# Patient Record
Sex: Female | Born: 1995 | Race: Black or African American | Hispanic: No | Marital: Single | Smoking: Never smoker
Health system: Southern US, Community
[De-identification: ages and names within clinical notes are randomized; demographics above are authoritative.]

## PROBLEM LIST (undated history)

## (undated) DIAGNOSIS — E282 Polycystic ovarian syndrome: Secondary | ICD-10-CM

## (undated) HISTORY — DX: Polycystic ovarian syndrome: E28.2

---
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
## 2013-01-16 ENCOUNTER — Encounter: Payer: Self-pay | Admitting: Internal Medicine

## 2013-01-30 ENCOUNTER — Telehealth: Payer: Self-pay | Admitting: Internal Medicine

## 2013-01-30 NOTE — Telephone Encounter (Signed)
Called patient advised patient to go to ER. Due to nausea vomiting and upper abdominal pain.

## 2013-01-30 NOTE — Telephone Encounter (Signed)
Patient Information:  Caller Name: Albin Felling  Phone: [phone]  Patient: Megan Buckley, Megan Buckley  Gender: Female  DOB: 16-Jun-1996  Age: 17 Years  PCP: Ronna Polio (Adults only)  Pregnant: No  Office Follow Up:  Does the office need to follow up with this patient?: Yes  Instructions For The Office: Please return call to Mom at [phone] regarding possible work in appt. for 01/30/13.  RN Note:  Mom states patient developed vomiting, onset 01/25/13. States emesis X 1 01/29/13. States no vomiting 01/30/13. No diarrhea. Afebrile. Mom states child tolerated clear fluids 01/30/13. Urinating normally for patient. Mucous membranes moist.  Mom states patient developed epigastric pain, onset 01/25/13 after vomiting. States patient continues to complain of upper abdominal discomfort. States patient is unable to ambulate in an upright position, at intervals, due to pain. States area is tender to touch. Care advice and diet advice given per guidelines. Call back parameters reviewed. Mom verbalizes understanding. Please return call to Mom at [phone] regarding possible work in appt. for 01/30/13.  Symptoms  Reason For Call & Symptoms: Vomiting  Reviewed Health History In EMR: Yes  Reviewed Medications In EMR: Yes  Reviewed Allergies In EMR: Yes  Reviewed Surgeries / Procedures: Yes  Date of Onset of Symptoms: 01/26/2013  Treatments Tried: Clear liquids  Treatments Tried Worked: No OB / GYN:  LMP: 01/18/2013  Guideline(s) Used:  Vomiting  Abdominal Pain - Female  Abdominal Pain - Upper  Disposition Per Guideline:   See Today in Office  Reason For Disposition Reached:   Patient wants to be seen  Advice Given:  Clear Liquids:  Sip water or a rehydration drink (e.g., Gatorade or Powerade).  Clear Liquids:  Sip water or a rehydration drink (e.g., Gatorade or Powerade).  Other options: 1/2 strength flat lemon-lime soda or ginger ale.  After 4 hours without vomiting, increase the  amount.  Expected Course:  Vomiting from viral gastritis usually stops in 12 to 48 hours.  Call Back If:  Signs of dehydration occur  You become worse.  Call Back If:  Signs of dehydration occur  You become worse.  Fluids:   Sip clear fluids only (e.g., water, flat soft drinks, or half-strength fruit juice) until the pain is gone for 2 hours. Then slowly return to a regular diet.  Call Back If:  Abdominal pain is constant and present for more than 2 hours.  You become worse.  Patient Will Follow Care Advice:  YES

## 2015-01-23 ENCOUNTER — Encounter: Payer: Self-pay | Admitting: Obstetrics & Gynecology

## 2015-02-12 ENCOUNTER — Telehealth: Payer: Self-pay | Admitting: *Deleted

## 2015-02-12 NOTE — Telephone Encounter (Signed)
Pt's mother contacted the clinic to request to speak with a nurse.  Contacted patient's mother, she states daughter is having pain and she thinks her daughter has fibroids.  Pt is in the bed unable to speak to nurse.  Informed mother that if pain is severe to go to [facility].  She verbalizes understanding.

## 2015-02-15 ENCOUNTER — Encounter: Payer: Self-pay | Admitting: Obstetrics & Gynecology

## 2015-02-22 ENCOUNTER — Ambulatory Visit (INDEPENDENT_AMBULATORY_CARE_PROVIDER_SITE_OTHER): Payer: 59 | Admitting: Obstetrics & Gynecology

## 2015-02-22 ENCOUNTER — Encounter: Payer: Self-pay | Admitting: Obstetrics & Gynecology

## 2015-02-22 VITALS — BP 142/80 | HR 84 | Ht 65.0 in | Wt 344.5 lb

## 2015-02-22 DIAGNOSIS — N939 Abnormal uterine and vaginal bleeding, unspecified: Secondary | ICD-10-CM | POA: Diagnosis not present

## 2015-02-22 MED ORDER — MISOPROSTOL 200 MCG PO TABS: 400.0000 ug | ORAL_TABLET | Freq: Once | ORAL | Status: DC

## 2015-02-22 NOTE — Patient Instructions (Signed)
Levonorgestrel intrauterine device (IUD) What is this medicine? LEVONORGESTREL IUD (LEE voe nor jes trel) is a contraceptive (birth control) device. The device is placed inside the uterus by a healthcare professional. It is used to prevent pregnancy and can also be used to treat heavy bleeding that occurs during your period. Depending on the device, it can be used for 3 to 5 years. This medicine may be used for other purposes; ask your health care provider or pharmacist if you have questions. COMMON BRAND NAME(S): LILETTA, Mirena, Skyla What should I tell my health care provider before I take this medicine? They need to know if you have any of these conditions: -abnormal Pap smear -cancer of the breast, uterus, or cervix -diabetes -endometritis -genital or pelvic infection now or in the past -have more than one sexual partner or your partner has more than one partner -heart disease -history of an ectopic or tubal pregnancy -immune system problems -IUD in place -liver disease or tumor -problems with blood clots or take blood-thinners -use intravenous drugs -uterus of unusual shape -vaginal bleeding that has not been explained -an unusual or allergic reaction to levonorgestrel, other hormones, silicone, or polyethylene, medicines, foods, dyes, or preservatives -pregnant or trying to get pregnant -breast-feeding How should I use this medicine? This device is placed inside the uterus by a health care professional. Talk to your pediatrician regarding the use of this medicine in children. Special care may be needed. Overdosage: If you think you have taken too much of this medicine contact a poison control center or emergency room at once. NOTE: This medicine is only for you. Do not share this medicine with others. What if I miss a dose? This does not apply. What may interact with this medicine? Do not take this medicine with any of the following  medications: -amprenavir -bosentan -fosamprenavir This medicine may also interact with the following medications: -aprepitant -barbiturate medicines for inducing sleep or treating seizures -bexarotene -griseofulvin -medicines to treat seizures like carbamazepine, ethotoin, felbamate, oxcarbazepine, phenytoin, topiramate -modafinil -pioglitazone -rifabutin -rifampin -rifapentine -some medicines to treat HIV infection like atazanavir, indinavir, lopinavir, nelfinavir, tipranavir, ritonavir -St. John's wort -warfarin This list may not describe all possible interactions. Give your health care provider a list of all the medicines, herbs, non-prescription drugs, or dietary supplements you use. Also tell them if you smoke, drink alcohol, or use illegal drugs. Some items may interact with your medicine. What should I watch for while using this medicine? Visit your doctor or health care professional for regular check ups. See your doctor if you or your partner has sexual contact with others, becomes HIV positive, or gets a sexual transmitted disease. This product does not protect you against HIV infection (AIDS) or other sexually transmitted diseases. You can check the placement of the IUD yourself by reaching up to the top of your vagina with clean fingers to feel the threads. Do not pull on the threads. It is a good habit to check placement after each menstrual period. Call your doctor right away if you feel more of the IUD than just the threads or if you cannot feel the threads at all. The IUD may come out by itself. You may become pregnant if the device comes out. If you notice that the IUD has come out use a backup birth control method like condoms and call your health care provider. Using tampons will not change the position of the IUD and are okay to use during your period. What side effects may   I notice from receiving this medicine? Side effects that you should report to your doctor or  health care professional as soon as possible: -allergic reactions like skin rash, itching or hives, swelling of the face, lips, or tongue -fever, flu-like symptoms -genital sores -high blood pressure -no menstrual period for 6 weeks during use -pain, swelling, warmth in the leg -pelvic pain or tenderness -severe or sudden headache -signs of pregnancy -stomach cramping -sudden shortness of breath -trouble with balance, talking, or walking -unusual vaginal bleeding, discharge -yellowing of the eyes or skin Side effects that usually do not require medical attention (report to your doctor or health care professional if they continue or are bothersome): -acne -breast pain -change in sex drive or performance -changes in weight -cramping, dizziness, or faintness while the device is being inserted -headache -irregular menstrual bleeding within first 3 to 6 months of use -nausea This list may not describe all possible side effects. Call your doctor for medical advice about side effects. You may report side effects to FDA at 1-800-FDA-1088. Where should I keep my medicine? This does not apply. NOTE: This sheet is a summary. It may not cover all possible information. If you have questions about this medicine, talk to your doctor, pharmacist, or health care provider.  2015, Elsevier/Gold Standard. (2011-08-27 13:54:04)  

## 2015-02-23 ENCOUNTER — Encounter: Payer: Self-pay | Admitting: Obstetrics & Gynecology

## 2015-02-23 NOTE — Progress Notes (Signed)
Patient ID: Megan Buckley, female   DOB: November 29, 1995, 19 y.o.   MRN: [id number] History:  19 y.o. G0P0000 here today for management of AUB prev dx'd as PCOS.  She reports a full workup prev.  She left her prev GYN because of issues with the starff. Pt was prev on OCPs which controlled her bleeding but,  Which she was been noncompliant in taking.  She reports that she would take them for several weeks until her bleeding improved then she could not remember.  Wants to discuss other tx options.  She is not currently or prev sexually active.  She reports that she does have at least 4 cycles per year.  Her last cycle lasted for several weeks but, she is not actively bleeding at present.   She noted a h/o increased facial hair that she had removed by laser prev.  She does not feel that she cold take a pill daily for the excessive hair growth.  Her family is well known to me [facility] [place]    The following portions of the patient's history were reviewed and updated as appropriate: allergies, current medications, past family history, past medical history, past social history, past surgical history and problem list.  Review of Systems:  Pertinent items are noted in HPI.  Objective:  Physical Exam Blood pressure 142/80, pulse 84, height 5\' 5"  (1.651 m), weight 344 lb 8 oz (156.264 kg), last menstrual period 12/23/2014. Pleasant 18 AA female Gen: NAD HEENT: acanthosis nigricans on back of neck.  Hair growth on chin.  Some scarring from prior attempt at Laser hair removal   Pelvic: deferred  Labs and Imaging No results found.  Assessment & Plan:  PCOS  I spent 40min with pt and her mother discussing treatment options for bleeding and management of obesity. I have reviewed with her the risks vs benefits of the LnIUD and OCPs.  Pt does not think she would be compliant on OCPs and would like to return for a LnIUD.  F/u in 3 days for a LnIUD Cytotec 400mcg 4-8 hours prior to IUD  placement.  Obtain records from prev OB/GYN.  Pt has signed a release of records    Hugh Garrow L. Harraway-Smith, M.D., Evern CoreFACOG

## 2015-02-25 ENCOUNTER — Ambulatory Visit (INDEPENDENT_AMBULATORY_CARE_PROVIDER_SITE_OTHER): Payer: 59 | Admitting: Obstetrics & Gynecology

## 2015-02-25 ENCOUNTER — Encounter: Payer: Self-pay | Admitting: Obstetrics & Gynecology

## 2015-02-25 VITALS — BP 138/53 | HR 83 | Temp 98.7°F | Ht 65.0 in | Wt 343.4 lb

## 2015-02-25 DIAGNOSIS — Z30430 Encounter for insertion of intrauterine contraceptive device: Secondary | ICD-10-CM | POA: Diagnosis not present

## 2015-02-25 DIAGNOSIS — N939 Abnormal uterine and vaginal bleeding, unspecified: Secondary | ICD-10-CM | POA: Diagnosis not present

## 2015-02-25 DIAGNOSIS — Z3202 Encounter for pregnancy test, result negative: Secondary | ICD-10-CM | POA: Diagnosis not present

## 2015-02-25 LAB — POCT PREGNANCY, URINE: PREG TEST UR: NEGATIVE

## 2015-02-25 MED ORDER — LEVONORGESTREL 20 MCG/24HR IU IUD
INTRAUTERINE_SYSTEM | Freq: Once | INTRAUTERINE | Status: AC
  Administered 2015-02-25: 1 via INTRAUTERINE

## 2015-02-25 NOTE — Progress Notes (Signed)
Patient ID: Megan SessionsMercedes Hemler, female   DOB: 08-12-1995, 19 y.o.   MRN: [id number] GYNECOLOGY CLINIC PROCEDURE NOTE  Megan Buckley is a 19 y.o. G0P0000 here for Mirena IUD insertion. h/o PCOS getting LnIUD for bleeding control.  IUD Insertion Procedure Note Patient identified, informed consent performed.  Discussed risks of irregular bleeding, cramping, infection, malpositioning or misplacement of the IUD outside the uterus which may require further procedures. Time out was performed.  Urine pregnancy test negative.  Speculum placed in the vagina.  Cervix visualized.  Cleaned with Betadine x 2.  Grasped anteriorly with a single tooth tenaculum.  Uterus sounded to 3 cm.  Mirena IUD placed per manufacturer's recommendations.  Strings trimmed to 3 cm. Tenaculum was removed, good hemostasis noted.  Patient tolerated procedure well.   Patient was given post-procedure instructions.  Patient was also asked to check IUD strings periodically and follow up in 2 months for IUD check and eval of bleeding.  Rilan Eiland L. Harraway-Smith, M.D., Evern CoreFACOG

## 2015-02-25 NOTE — Patient Instructions (Signed)
Levonorgestrel intrauterine device (IUD) What is this medicine? LEVONORGESTREL IUD (LEE voe nor jes trel) is a contraceptive (birth control) device. The device is placed inside the uterus by a healthcare professional. It is used to prevent pregnancy and can also be used to treat heavy bleeding that occurs during your period. Depending on the device, it can be used for 3 to 5 years. This medicine may be used for other purposes; ask your health care provider or pharmacist if you have questions. COMMON BRAND NAME(S): LILETTA, Mirena, Skyla What should I tell my health care provider before I take this medicine? They need to know if you have any of these conditions: -abnormal Pap smear -cancer of the breast, uterus, or cervix -diabetes -endometritis -genital or pelvic infection now or in the past -have more than one sexual partner or your partner has more than one partner -heart disease -history of an ectopic or tubal pregnancy -immune system problems -IUD in place -liver disease or tumor -problems with blood clots or take blood-thinners -use intravenous drugs -uterus of unusual shape -vaginal bleeding that has not been explained -an unusual or allergic reaction to levonorgestrel, other hormones, silicone, or polyethylene, medicines, foods, dyes, or preservatives -pregnant or trying to get pregnant -breast-feeding How should I use this medicine? This device is placed inside the uterus by a health care professional. Talk to your pediatrician regarding the use of this medicine in children. Special care may be needed. Overdosage: If you think you have taken too much of this medicine contact a poison control center or emergency room at once. NOTE: This medicine is only for you. Do not share this medicine with others. What if I miss a dose? This does not apply. What may interact with this medicine? Do not take this medicine with any of the following  medications: -amprenavir -bosentan -fosamprenavir This medicine may also interact with the following medications: -aprepitant -barbiturate medicines for inducing sleep or treating seizures -bexarotene -griseofulvin -medicines to treat seizures like carbamazepine, ethotoin, felbamate, oxcarbazepine, phenytoin, topiramate -modafinil -pioglitazone -rifabutin -rifampin -rifapentine -some medicines to treat HIV infection like atazanavir, indinavir, lopinavir, nelfinavir, tipranavir, ritonavir -St. John's wort -warfarin This list may not describe all possible interactions. Give your health care provider a list of all the medicines, herbs, non-prescription drugs, or dietary supplements you use. Also tell them if you smoke, drink alcohol, or use illegal drugs. Some items may interact with your medicine. What should I watch for while using this medicine? Visit your doctor or health care professional for regular check ups. See your doctor if you or your partner has sexual contact with others, becomes HIV positive, or gets a sexual transmitted disease. This product does not protect you against HIV infection (AIDS) or other sexually transmitted diseases. You can check the placement of the IUD yourself by reaching up to the top of your vagina with clean fingers to feel the threads. Do not pull on the threads. It is a good habit to check placement after each menstrual period. Call your doctor right away if you feel more of the IUD than just the threads or if you cannot feel the threads at all. The IUD may come out by itself. You may become pregnant if the device comes out. If you notice that the IUD has come out use a backup birth control method like condoms and call your health care provider. Using tampons will not change the position of the IUD and are okay to use during your period. What side effects may   I notice from receiving this medicine? Side effects that you should report to your doctor or  health care professional as soon as possible: -allergic reactions like skin rash, itching or hives, swelling of the face, lips, or tongue -fever, flu-like symptoms -genital sores -high blood pressure -no menstrual period for 6 weeks during use -pain, swelling, warmth in the leg -pelvic pain or tenderness -severe or sudden headache -signs of pregnancy -stomach cramping -sudden shortness of breath -trouble with balance, talking, or walking -unusual vaginal bleeding, discharge -yellowing of the eyes or skin Side effects that usually do not require medical attention (report to your doctor or health care professional if they continue or are bothersome): -acne -breast pain -change in sex drive or performance -changes in weight -cramping, dizziness, or faintness while the device is being inserted -headache -irregular menstrual bleeding within first 3 to 6 months of use -nausea This list may not describe all possible side effects. Call your doctor for medical advice about side effects. You may report side effects to FDA at 1-800-FDA-1088. Where should I keep my medicine? This does not apply. NOTE: This sheet is a summary. It may not cover all possible information. If you have questions about this medicine, talk to your doctor, pharmacist, or health care provider.  2015, Elsevier/Gold Standard. (2011-08-27 13:54:04)  

## 2015-04-16 ENCOUNTER — Telehealth: Payer: Self-pay

## 2015-04-16 NOTE — Telephone Encounter (Signed)
Pt call was transferred from the front desk and pt informed me that she had a IUD placed and she is having abdominal pain and she is still bleeding.  Pt received IUD on 02/22/15 for bleeding.   I advised pt that the information book that she should have received informs her that she will have irregular bleeding and some cramping pain that is normal because her body is having to get used to the device.  I informed pt that it normally takes about 3-6 months for the cramping to subside and that her bleeding should slow down.  Pt stated "ok".

## 2015-04-29 ENCOUNTER — Ambulatory Visit: Payer: 59 | Admitting: Obstetrics & Gynecology

## 2015-05-10 ENCOUNTER — Encounter: Payer: Self-pay | Admitting: Obstetrics & Gynecology

## 2015-05-10 ENCOUNTER — Ambulatory Visit (INDEPENDENT_AMBULATORY_CARE_PROVIDER_SITE_OTHER): Payer: 59 | Admitting: Obstetrics & Gynecology

## 2015-05-10 VITALS — BP 123/84 | HR 91 | Ht 65.0 in | Wt 341.3 lb

## 2015-05-10 DIAGNOSIS — E282 Polycystic ovarian syndrome: Secondary | ICD-10-CM

## 2015-05-10 MED ORDER — NORETHINDRONE ACET-ETHINYL EST 1.5-30 MG-MCG PO TABS: 1.0000 | ORAL_TABLET | Freq: Every day | ORAL | Status: DC

## 2015-05-10 NOTE — Progress Notes (Signed)
Patient ID: Megan Buckley, female   DOB: 1996-05-09, 19 y.o.   MRN: [id number] History:  19 y.o. G0P0000 here today for f/u of AUB.  She reports that her IUD fell out ~ 2 weeks prev.  Prior to falling out she reports increased bleeding and pain and did not like the sx at all.  She decline replacement of the IUD.  In the past she has been on OCPs with no side effect however, she has a hard time remembering to take the pills.  She reports that given the response from the IUD she will take the OCPs daily.   The following portions of the patient's history were reviewed and updated as appropriate: allergies, current medications, past family history, past medical history, past social history, past surgical history and problem list.  Review of Systems:  Pertinent items are noted in HPI.  Objective:  Physical Exam Blood pressure 123/84, pulse 91, height  (1.651 m), weight 341 lb 4.8 oz (154.813 kg). Gen: NAD Abd: Soft, nontender and nondistended Pelvic: Normal appearing external genitalia; normal appearing vaginal mucosa and cervix.  Normal discharge.  Small uterus, no other palpable masses, no uterine or adnexal tenderness  Labs and Imaging No results found.  Assessment & Plan:  AUB due to PCOS. Begin LoEstrin 1.5/30 on the first day of her next menses F/u in 3-4 months  Carolyn L. Harraway-Smith, M.D., [place]

## 2015-05-10 NOTE — Patient Instructions (Signed)

## 2016-07-07 ENCOUNTER — Emergency Department (HOSPITAL_COMMUNITY): Payer: 59

## 2016-07-07 ENCOUNTER — Encounter (HOSPITAL_COMMUNITY): Payer: Self-pay

## 2016-07-07 ENCOUNTER — Emergency Department (HOSPITAL_COMMUNITY)
Admission: EM | Admit: 2016-07-07 | Discharge: 2016-07-07 | Disposition: A | Discharge status: Home / Self Care | Payer: 59 | Attending: Emergency Medicine | Admitting: Emergency Medicine

## 2016-07-07 DIAGNOSIS — Z79899 Other long term (current) drug therapy: Secondary | ICD-10-CM | POA: Insufficient documentation

## 2016-07-07 DIAGNOSIS — Y999 Unspecified external cause status: Secondary | ICD-10-CM | POA: Insufficient documentation

## 2016-07-07 DIAGNOSIS — J45909 Unspecified asthma, uncomplicated: Secondary | ICD-10-CM | POA: Diagnosis not present

## 2016-07-07 DIAGNOSIS — M542 Cervicalgia: Secondary | ICD-10-CM | POA: Insufficient documentation

## 2016-07-07 DIAGNOSIS — Y92410 Unspecified street and highway as the place of occurrence of the external cause: Secondary | ICD-10-CM | POA: Insufficient documentation

## 2016-07-07 DIAGNOSIS — Y939 Activity, unspecified: Secondary | ICD-10-CM | POA: Insufficient documentation

## 2016-07-07 DIAGNOSIS — Z91040 Latex allergy status: Secondary | ICD-10-CM | POA: Insufficient documentation

## 2016-07-07 DIAGNOSIS — M545 Low back pain: Secondary | ICD-10-CM | POA: Insufficient documentation

## 2016-07-07 DIAGNOSIS — M549 Dorsalgia, unspecified: Secondary | ICD-10-CM

## 2016-07-07 LAB — POC URINE PREG, ED: PREG TEST UR: NEGATIVE

## 2016-07-07 MED ORDER — METHOCARBAMOL 500 MG PO TABS: 500.0000 mg | ORAL_TABLET | Freq: Every evening | ORAL | 0 refills | Status: AC | PRN

## 2016-07-07 MED ORDER — METHOCARBAMOL 500 MG PO TABS
1000.0000 mg | ORAL_TABLET | Freq: Once | ORAL | Status: AC
  Administered 2016-07-07: 1000 mg via ORAL
  Filled 2016-07-07: qty 2

## 2016-07-07 MED ORDER — KETOROLAC TROMETHAMINE 60 MG/2ML IM SOLN
60.0000 mg | Freq: Once | INTRAMUSCULAR | Status: AC
  Administered 2016-07-07: 60 mg via INTRAMUSCULAR
  Filled 2016-07-07: qty 2
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
## 2016-07-07 NOTE — ED Notes (Signed)
PT DISCHARGED. INSTRUCTIONS AND PRESCRIPTION GIVEN. AAOX4. PT IN NO APPARENT DISTRESS. THE OPPORTUNITY TO ASK QUESTIONS WAS PROVIDED. 

## 2016-07-07 NOTE — Discharge Instructions (Signed)
Take anti-inflammatory medicines for the next week. Take this medicine with food. °Take muscle relaxer at bedtime to help you sleep. This medicine makes you drowsy so do not take before driving or work °Use a heating pad for sore muscles - use for 20 minutes several times a day ° °
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
## 2016-11-10 ENCOUNTER — Ambulatory Visit (INDEPENDENT_AMBULATORY_CARE_PROVIDER_SITE_OTHER): Payer: 59 | Admitting: Clinical

## 2016-11-10 ENCOUNTER — Ambulatory Visit (INDEPENDENT_AMBULATORY_CARE_PROVIDER_SITE_OTHER): Payer: 59 | Admitting: Obstetrics & Gynecology

## 2016-11-10 VITALS — BP 132/112 | HR 103 | Ht 65.0 in | Wt 334.4 lb

## 2016-11-10 DIAGNOSIS — B373 Candidiasis of vulva and vagina: Secondary | ICD-10-CM | POA: Diagnosis not present

## 2016-11-10 DIAGNOSIS — B3731 Acute candidiasis of vulva and vagina: Secondary | ICD-10-CM

## 2016-11-10 DIAGNOSIS — E282 Polycystic ovarian syndrome: Secondary | ICD-10-CM | POA: Diagnosis not present

## 2016-11-10 DIAGNOSIS — F32A Depression, unspecified: Secondary | ICD-10-CM

## 2016-11-10 DIAGNOSIS — F332 Major depressive disorder, recurrent severe without psychotic features: Secondary | ICD-10-CM | POA: Diagnosis not present

## 2016-11-10 DIAGNOSIS — F329 Major depressive disorder, single episode, unspecified: Secondary | ICD-10-CM | POA: Diagnosis not present

## 2016-11-10 MED ORDER — FLUCONAZOLE 150 MG PO TABS: 150.0000 mg | ORAL_TABLET | Freq: Once | ORAL | 1 refills | Status: AC

## 2016-11-10 MED ORDER — NORETHINDRONE ACET-ETHINYL EST 1.5-30 MG-MCG PO TABS: 1.0000 | ORAL_TABLET | Freq: Every day | ORAL | 11 refills | Status: AC
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
## 2016-11-10 NOTE — Progress Notes (Signed)
Patient states being off of birth control her menstrual cycle is very irregular and sometimes she wont have one for 3 months.

## 2016-11-10 NOTE — Progress Notes (Signed)
Patient and/or legal guardian verbally consented to meet with Behavioral Health Clinician about presenting concerns.   

## 2016-11-10 NOTE — Progress Notes (Signed)
Subjective:     Megan Buckley is a 21 y.o. female here for a routine exam. Mar 2018. Last year had 2 cycles. She was  Current complaints: raw and itchy vulvar area.Pt followed by Dr. Aldean Ast- [facility] in [place] for her DM.  She was recently dx'd. She does not check her glc at home and is not aware what her glc levesl are or should be. She is not sexually active.  Her mother had been ill and had multiple surgeries.    Gynecologic History No LMP recorded. Patient is not currently having periods (Reason: Irregular Periods). Contraception: abstinence Last Pap: never had Last mammogram: n/a  Obstetric History OB History  Gravida Para Term Preterm AB Living  0 0 0 0 0 0  SAB TAB Ectopic Multiple Live Births  0 0 0 0          The following portions of the patient's history were reviewed and updated as appropriate: allergies, current medications, past family history, past medical history, past social history, past surgical history and problem list.  Review of Systems Pertinent items are noted in HPI.    Objective:  BP (!) 132/112   Pulse (!) 103   Ht  (1.651 m)   Wt (!) 334 lb 6.4 oz (151.7 kg)   BMI 55.65 kg/m  General Appearance:    Alert, cooperative, no distress, appears stated age  Head:    Normocephalic, without obvious abnormality, atraumatic  Eyes:    conjunctiva/corneas clear, EOM's intact, both eyes  Ears:    Normal external ear canals, both ears  Nose:   Nares normal, septum midline, mucosa normal, no drainage    or sinus tenderness  Throat:   Lips, mucosa, and tongue normal; teeth and gums normal  Neck:   Supple, symmetrical, trachea midline, no adenopathy;    thyroid:  no enlargement/tenderness/nodules  Back:     Symmetric, no curvature, ROM normal, no CVA tenderness  Lungs:     Clear to auscultation bilaterally, respirations unlabored  Chest Wall:    No tenderness or deformity   Heart:    Regular rate and rhythm, S1 and S2 normal, no murmur, rub    or gallop  Breast Exam:    No tenderness, masses, or nipple abnormality  Abdomen:     Soft, non-tender, obese, bowel sounds active all four quadrants,    no masses, no organomegaly  Genitalia:    Pt with beefy red external genitalia with thick white cottage cheese like discharge. Due to pts discomfort with exam will defer internal exam until rash under better control.       Extremities:   Extremities normal, atraumatic, no cyanosis or edema  Pulses:   2+ and symmetric all extremities  Skin:   Skin color, texture, turgor show acanthosis nigrican on her neck and between her breasts.  There is a rash on her right breast that was the result of a sunburn      Assessment:    GYN exam Depression - pt seen by behavioral health today Severe yeast vaginitis- I suspect uncontrolled DM  PCOS  Plan:    Diflucan  po q 3 days x 3    f/u with primary care or endocrine for DM management- pt has f/u appt in the next week f/u in 4 weeks for recheck or sooner prn Restart LoEstrin 1.5/30 reviewed use  Total face-to-face time with patient was 25 min.  Greater than 50% was spent in counseling and coordination of  care with the patient. We discussed DM, yeast, PCOS, OCP use

## 2016-11-18 ENCOUNTER — Institutional Professional Consult (permissible substitution): Payer: 59

## 2016-11-18 NOTE — BH Specialist Note (Deleted)
Integrated Behavioral Health Follow Up Visit  MRN: [id number] Name: Megan Buckley   Session Start time: *** Session End time: *** Total time: {IBH Total WUJW:11914782} Number of Integrated Behavioral Health Clinician visits: {IBH Number of Visits:21014052}  Type of Service: Integrated Behavioral Health- Individual/Family Interpretor:{yes [id number]} Interpretor Name and Language: ***   Warm Hand Off Completed.       SUBJECTIVE: Megan Buckley is a 21 y.o. female accompanied by {Persons; PED relatives w/patient:19415}. Patient was referred by *** for ***. Patient reports the following symptoms/concerns: *** Duration of problem: ***; Severity of problem: {Mild/Moderate/Severe:20260}  OBJECTIVE: Mood: {BHH MOOD:22306} and Affect: {BHH AFFECT:22307} Risk of harm to self or others: {CHL AMB BH Suicide Current Mental Status:21022748}   LIFE CONTEXT: Family and Social: *** School/Work: *** Self-Care: *** Life Changes: ***  GOALS ADDRESSED: Patient will reduce symptoms of: {IBH Symptoms:21014056} and increase knowledge and/or ability of: {IBH Patient Tools:21014057} and also: {IBH Goals:21014053}  INTERVENTIONS: {IBH Interventions:21014054} Standardized Assessments completed: {IBH Screening Tools:21014051}  ASSESSMENT: Patient currently experiencing ***. Patient may benefit from ***.  PLAN: 1. Follow up with behavioral health clinician on : *** 2. Behavioral recommendations: *** 3. Referral(s): {IBH Referrals:21014055} 4. "From scale of 1-10, how likely are you to follow plan?": ***  Jamie C McMannes, LCSWA

## 2016-12-08 ENCOUNTER — Ambulatory Visit (INDEPENDENT_AMBULATORY_CARE_PROVIDER_SITE_OTHER): Payer: 59 | Admitting: Obstetrics & Gynecology

## 2016-12-08 ENCOUNTER — Ambulatory Visit (INDEPENDENT_AMBULATORY_CARE_PROVIDER_SITE_OTHER): Payer: Self-pay | Admitting: Clinical

## 2016-12-08 VITALS — BP 133/86 | HR 104 | Ht 65.5 in | Wt 335.4 lb

## 2016-12-08 DIAGNOSIS — E0800 Diabetes mellitus due to underlying condition with hyperosmolarity without nonketotic hyperglycemic-hyperosmolar coma (NKHHC): Secondary | ICD-10-CM | POA: Diagnosis not present

## 2016-12-08 DIAGNOSIS — B373 Candidiasis of vulva and vagina: Secondary | ICD-10-CM

## 2016-12-08 DIAGNOSIS — N6310 Unspecified lump in the right breast, unspecified quadrant: Secondary | ICD-10-CM

## 2016-12-08 DIAGNOSIS — F339 Major depressive disorder, recurrent, unspecified: Secondary | ICD-10-CM

## 2016-12-08 DIAGNOSIS — N6315 Unspecified lump in the right breast, overlapping quadrants: Secondary | ICD-10-CM

## 2016-12-08 DIAGNOSIS — B3731 Acute candidiasis of vulva and vagina: Secondary | ICD-10-CM

## 2016-12-08 MED ORDER — FLUCONAZOLE 150 MG PO TABS: 150.0000 mg | ORAL_TABLET | Freq: Once | ORAL | 0 refills | Status: AC
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
## 2016-12-08 NOTE — Progress Notes (Signed)
Pt. has lump under right breast. States that lump has been under breast  for 3 days. States that lump is painful when bra rubs against it.

## 2016-12-08 NOTE — Progress Notes (Addendum)
History:  21 y.o. G0P0000 here today for f/u of severe yeast infection.  Pt reports that prev she did not take her medications for DM but, she has now started to take them. She reports that her sx have improved markedly. She reports that she was prev on  The highest dose and had adverse GI sx.  She was started high and then reduced to a lower level.  She reports freq breast lumps that come and resolve spon. She reports the recent development of a lump in her right breast that is tender and larger than the prev ones.  She reports that she was seen by the [facility] at [facility] but, she has not been seen recently. She reports that she does not want to f/u with the PA. She does not know the ranges of normal vs abnormal Glc levels.   The following portions of the patient's history were reviewed and updated as appropriate: allergies, current medications, past family history, past medical history, past social history, past surgical history and problem list.  Review of Systems:  Pertinent items are noted in HPI.   Objective:  Physical Exam Blood pressure 133/86, pulse (!) 104, height 5' 5.5" (1.664 m), weight (!) 335 lb 6.4 oz (152.1 kg), last menstrual period 11/28/2016. BP 133/86   Pulse (!) 104   Ht 5' 5.5" (1.664 m)   Wt (!) 335 lb 6.4 oz (152.1 kg)   LMP 11/28/2016 (Exact Date)   BMI 54.96 kg/m  CONSTITUTIONAL: Well-developed, well-nourished female in no acute distress.  HENT:  Normocephalic, atraumatic EYES: Conjunctivae and EOM are normal. No scleral icterus.  NECK: Normal range of motion SKIN: Skin is warm and dry. No rash noted. Not diaphoretic.No pallor. NEUROLGIC: Alert and oriented to person, place, and time. Normal coordination.  Breast: pendulous breasts. On the right beast she has a tender nodule that is 3 cm in size.  There is no warmth. It appears to be mobile.   No skin changes. No nipple discharge Abd: Soft, nontender and nondistended Pelvic: external genitalia- still with  changes suggestive of chronic yeast vaginitis however, this is markedly improved since her last visit.  There are changes mainly ant this inclues erythema; thickened tissue and hypopigmentation.   Assessment & Plan:  Poorly controlled diabetes- pt has no idea what her glucose results are or what the expected values should be.  She reports that she was prev seen by the NP and reports that she did not feel properly cared for.  She reported that she has had issues scheduling visits with the MD [facility] is [facility] [place]. This is a Hydrologist.  Metformin XL. Increase from 500 to  daily   The nurse here has called and made an appt for the pt in 1 week with MD   Right breast cyst-   Need right breast US. If cystic may need to have drained.  Chronic yeast-  Diflucan  po q 3 days x 3  f/u in 1 month or sooner prn  Total face-to-face time with patient was 25 min.  Greater than 50% was spent in counseling and coordination of care with the patient.   Larhonda Dettloff L. Harraway-Smith, M.D., [place]

## 2016-12-08 NOTE — Patient Instructions (Signed)
Breast Cyst A breast cyst is a sac in the breast that is filled with fluid. Breast cysts are usually noncancerous (benign). They are common among women, and they are most often located in the upper, outer portion of the breast. One or more cysts may develop. They form when fluid builds up inside of the breast glands. There are several types of breast cysts:  Macrocyst. This is a cyst that is about 2 inches (5.1 cm) across (in diameter).  Microcyst. This is a very small cyst that you cannot feel, but it can be seen with imaging tests such as an X-ray of the breast (mammogram) or ultrasound.  Galactocele. This is a cyst that contains milk. It may develop if you suddenly stop breastfeeding.  Breast cysts do not increase your risk of breast cancer. They usually disappear after menopause, unless you take artificial hormones (are on hormone therapy). What are the causes? The exact cause of breast cysts is not known. Possible causes include:  Blockage of tubes (ducts) in the breast glands, which leads to fluid buildup. Duct blockage may result from: ? Fibrocystic breast changes. This is a common, benign condition that occurs when women go through hormonal changes during the menstrual cycle. This is a common cause of multiple breast cysts. ? Overgrowth of breast tissue or breast glands. ? Scar tissue in the breast from previous surgery.  Changes in certain female hormones (estrogen and progesterone).  What increases the risk? You may be more likely to develop breast cysts if you have not gone through menopause. What are the signs or symptoms? Symptoms of a breast cyst may include:  Feeling one or more smooth, round, soft lumps (like grapes) in the breast that are easily moveable. The lump(s) may get bigger and more painful before your period and get smaller after your period.  Breast discomfort or pain.  How is this diagnosed? A cyst can be felt during a physical exam by your health care  provider. A mammogram and ultrasound will be done to confirm the diagnosis. Fluid may be removed from the cyst with a needle (fine-needle aspiration) and tested to make sure the cyst is not cancerous. How is this treated? Treatment may not be necessary. Your health care provider may monitor the cyst to see if it goes away on its own. If the cyst is uncomfortable or gets bigger, or if you do not like how the cyst makes your breast look, you may need treatment. Treatment may include:  Hormone treatment.  Fine-needle aspiration, to drain fluid from the cyst. There is a chance of the cyst coming back (recurring) after aspiration.  Surgery to remove the cyst.  Follow these instructions at home:  See your health care provider regularly. ? Get a yearly physical exam. ? If you are 20-40 years old, get a clinical breast exam every 1-3 years. After age 40, get this exam every year. ? Get mammograms as often as directed.  Do a breast self-exam every month, or as often as directed. Having many breast cysts, or "lumpy" breasts, may make it harder to feel for new lumps. Understand how your breasts normally look and feel, and write down any changes in your breasts so you can tell your health care provider about the changes. A breast self-exam involves: ? Comparing your breasts in the mirror. ? Looking for visible changes in your skin or nipples. ? Feeling for lumps or changes.  Take over-the-counter and prescription medicines only as told by your health care   provider.  Wear a supportive bra, especially when exercising.  Follow instructions from your health care provider about eating and drinking restrictions. ? Avoid caffeine. ? Cut down on salt (sodium) in what you eat and drink, especially before your menstrual period. Too much sodium can cause fluid buildup (retention), breast swelling, and discomfort.  Keep all follow-up visits as told your health care provider. This is important. Contact a  health care provider if:  You feel, or think you feel, a lump in your breast.  You notice that both breasts look or feel different than usual.  Your breast is still causing pain after your menstrual period is over.  You find new lumps or bumps that were not there before.  You feel lumps in your armpit (axilla). Get help right away if:  You have severe pain, tenderness, redness, or warmth in your breast.  You have fluid or blood leaking from your nipple.  Your breast lump becomes hard and painful.  You notice dimpling or wrinkling of the breast or nipple. This information is not intended to replace advice given to you by your health care provider. Make sure you discuss any questions you have with your health care provider. Document Released: 07/27/2005 Document Revised: 04/17/2016 Document Reviewed: 04/17/2016 Elsevier Interactive Patient Education  2017 [employer]  

## 2016-12-09 ENCOUNTER — Other Ambulatory Visit: Payer: Self-pay | Admitting: Obstetrics & Gynecology

## 2016-12-09 ENCOUNTER — Telehealth: Payer: Self-pay | Admitting: *Deleted

## 2016-12-09 ENCOUNTER — Ambulatory Visit
Admission: RE | Admit: 2016-12-09 | Discharge: 2016-12-09 | Disposition: A | Discharge status: Home / Self Care | Payer: 59 | Source: Ambulatory Visit | Attending: Obstetrics & Gynecology | Admitting: Obstetrics & Gynecology

## 2016-12-09 ENCOUNTER — Other Ambulatory Visit (HOSPITAL_COMMUNITY)
Admission: RE | Admit: 2016-12-09 | Discharge: 2016-12-09 | Disposition: A | Discharge status: Home / Self Care | Payer: 59 | Source: Ambulatory Visit | Attending: Body Imaging | Admitting: Body Imaging

## 2016-12-09 ENCOUNTER — Other Ambulatory Visit: Payer: Self-pay

## 2016-12-09 DIAGNOSIS — N6315 Unspecified lump in the right breast, overlapping quadrants: Secondary | ICD-10-CM

## 2016-12-09 DIAGNOSIS — N6310 Unspecified lump in the right breast, unspecified quadrant: Principal | ICD-10-CM

## 2016-12-09 DIAGNOSIS — R895 Abnormal microbiological findings in specimens from other organs, systems and tissues: Secondary | ICD-10-CM | POA: Insufficient documentation

## 2016-12-09 DIAGNOSIS — N611 Abscess of the breast and nipple: Secondary | ICD-10-CM | POA: Insufficient documentation

## 2016-12-09 NOTE — Telephone Encounter (Signed)
Received call from [facility] stating that pt was there for scheduled Rt breast US. She stated recommendation by Radiologist to have aspiration of Rt breast lump today and needs an order. I provided the verbal order and will send message to Dr. Erin Fulling to sign off the order. Delice Bison voiced understanding.

## 2016-12-14 ENCOUNTER — Other Ambulatory Visit: Payer: Self-pay

## 2016-12-16 LAB — AEROBIC/ANAEROBIC CULTURE (SURGICAL/DEEP WOUND)

## 2016-12-16 LAB — AEROBIC/ANAEROBIC CULTURE W GRAM STAIN (SURGICAL/DEEP WOUND)

## 2016-12-18 ENCOUNTER — Other Ambulatory Visit: Payer: Self-pay

## 2016-12-21 ENCOUNTER — Telehealth: Payer: Self-pay | Admitting: Obstetrics & Gynecology

## 2016-12-21 NOTE — Telephone Encounter (Signed)
TC to pt. She cancelled her breast f/u appt. She did reschedule. She repots that the pain is gone but, she does feel a small lump still.  She was encouraged to keep her f/u appt.  Reynald Woods L. Harraway-Smith, M.D., [facility]FACOG

## 2016-12-31 ENCOUNTER — Other Ambulatory Visit: Payer: Self-pay

## 2017-01-06 ENCOUNTER — Ambulatory Visit: Payer: Self-pay | Admitting: Obstetrics & Gynecology

## 2017-01-11 ENCOUNTER — Telehealth: Payer: Self-pay | Admitting: General Practice

## 2017-01-11 NOTE — Telephone Encounter (Signed)
Patient no showed for appt on 5/30. Called patient, no answer- left message stating we are trying to reach you regarding a missed appt, please call us back to reschedule. Will send letter
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
